# Patient Record
Sex: Male | Born: 2005 | Race: Black or African American | Hispanic: No | Marital: Single | Smoking: Never smoker
Health system: Southern US, Community
[De-identification: ages and names within clinical notes are randomized; demographics above are authoritative.]

## PROBLEM LIST (undated history)

## (undated) DIAGNOSIS — T7840XA Allergy, unspecified, initial encounter: Secondary | ICD-10-CM

## (undated) HISTORY — PX: CRANIECTOMY FOR CRANIOSYNOSTOSIS: SUR323

## (undated) HISTORY — PX: EYE SURGERY: SHX253

---
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
## 2024-03-07 ENCOUNTER — Encounter (HOSPITAL_BASED_OUTPATIENT_CLINIC_OR_DEPARTMENT_OTHER): Payer: Self-pay | Admitting: *Deleted

## 2024-03-14 ENCOUNTER — Ambulatory Visit (HOSPITAL_BASED_OUTPATIENT_CLINIC_OR_DEPARTMENT_OTHER): Payer: Medicaid Other | Admitting: Family Medicine

## 2024-04-24 ENCOUNTER — Telehealth (HOSPITAL_BASED_OUTPATIENT_CLINIC_OR_DEPARTMENT_OTHER): Payer: Self-pay | Admitting: *Deleted

## 2024-04-24 NOTE — Telephone Encounter (Signed)
 Printed out last wellness examination for pick up

## 2024-04-24 NOTE — Telephone Encounter (Signed)
 Please let patient know this cannot be emailed. She can have the copy in [place] or she can come by and pick up a copy of this physical. If not able to come by, can mail patient a copy

## 2024-04-24 NOTE — Telephone Encounter (Signed)
 Copied from CRM #[id number]. Topic: General - Other >> Apr 20, 2024  3:57 PM Shardie S wrote: Reason for CRM: Patient is requesting a copy of his recent physical. States he would like to have a copy emailed.

## 2024-09-14 ENCOUNTER — Telehealth (HOSPITAL_BASED_OUTPATIENT_CLINIC_OR_DEPARTMENT_OTHER): Payer: Self-pay | Admitting: Family Medicine

## 2024-09-14 NOTE — Telephone Encounter (Signed)
 Copied from CRM #[id number]. Topic: Appointments - Scheduling Inquiry for Clinic >> Sep 14, 2024  8:24 AM Jeoffrey H wrote: Perley (patients mom) called and stated he is going back to school on 09/21/2024 and is needed a physical before then. There are no openings until 12/2024. Please reach out to mom for an earlier appt.    Perley- [phone]   See if we can put on Lauraine Minerva schedule please

## 2024-09-18 ENCOUNTER — Ambulatory Visit (INDEPENDENT_AMBULATORY_CARE_PROVIDER_SITE_OTHER)

## 2024-09-18 ENCOUNTER — Encounter (HOSPITAL_BASED_OUTPATIENT_CLINIC_OR_DEPARTMENT_OTHER): Payer: Self-pay

## 2024-09-18 VITALS — BP 127/81 | HR 88 | Ht 67.0 in | Wt 178.1 lb

## 2024-09-18 DIAGNOSIS — Z0000 Encounter for general adult medical examination without abnormal findings: Secondary | ICD-10-CM

## 2024-09-18 LAB — LIPID PANEL
Chol/HDL Ratio: 3.9 ratio (ref 0.0–5.0)
Cholesterol, Total: 210 mg/dL — ABNORMAL HIGH (ref 100–169)
HDL: 54 mg/dL
LDL Chol Calc (NIH): 146 mg/dL — ABNORMAL HIGH (ref 0–109)
Triglycerides: 57 mg/dL (ref 0–89)
VLDL Cholesterol Cal: 10 mg/dL (ref 5–40)

## 2024-09-18 LAB — CBC WITH DIFFERENTIAL/PLATELET
Basophils Absolute: 0 x10E3/uL (ref 0.0–0.2)
Basos: 1 %
EOS (ABSOLUTE): 0.1 x10E3/uL (ref 0.0–0.4)
Eos: 2 %
Hematocrit: 45 % (ref 37.5–51.0)
Hemoglobin: 14.8 g/dL (ref 13.0–17.7)
Immature Grans (Abs): 0 x10E3/uL (ref 0.0–0.1)
Immature Granulocytes: 0 %
Lymphocytes Absolute: 2.1 x10E3/uL (ref 0.7–3.1)
Lymphs: 47 %
MCH: 30.6 pg (ref 26.6–33.0)
MCHC: 32.9 g/dL (ref 31.5–35.7)
MCV: 93 fL (ref 79–97)
Monocytes Absolute: 0.4 x10E3/uL (ref 0.1–0.9)
Monocytes: 8 %
Neutrophils Absolute: 1.8 x10E3/uL (ref 1.4–7.0)
Neutrophils: 42 %
Platelets: 316 x10E3/uL (ref 150–450)
RBC: 4.83 x10E6/uL (ref 4.14–5.80)
RDW: 12.5 % (ref 11.6–15.4)
WBC: 4.4 x10E3/uL (ref 3.4–10.8)

## 2024-09-18 LAB — COMPREHENSIVE METABOLIC PANEL WITH GFR
ALT: 9 IU/L (ref 0–44)
AST: 7 IU/L (ref 0–40)
Albumin: 5.1 g/dL (ref 4.3–5.2)
Alkaline Phosphatase: 67 IU/L (ref 51–125)
BUN/Creatinine Ratio: 13 (ref 9–20)
BUN: 16 mg/dL (ref 6–20)
Bilirubin Total: 0.4 mg/dL (ref 0.0–1.2)
CO2: 23 mmol/L (ref 20–29)
Calcium: 9.9 mg/dL (ref 8.7–10.2)
Chloride: 106 mmol/L (ref 96–106)
Creatinine, Ser: 1.2 mg/dL (ref 0.76–1.27)
Globulin, Total: 2.3 g/dL (ref 1.5–4.5)
Glucose: 107 mg/dL — ABNORMAL HIGH (ref 70–99)
Potassium: 4.3 mmol/L (ref 3.5–5.2)
Sodium: 146 mmol/L — ABNORMAL HIGH (ref 134–144)
Total Protein: 7.4 g/dL (ref 6.0–8.5)
eGFR: 89 mL/min/1.73

## 2024-09-18 LAB — HEMOGLOBIN A1C
Est. average glucose Bld gHb Est-mCnc: 114 mg/dL
Hgb A1c MFr Bld: 5.6 % (ref 4.8–5.6)

## 2024-09-18 LAB — TSH: TSH: 1.93 u[IU]/mL (ref 0.450–4.500)

## 2024-09-18 NOTE — Progress Notes (Signed)
 "  Subjective:   Jason Hoffman 08/06/2006 09/18/2024  CC: Chief Complaint  Patient presents with   Annual Exam    Pt is here for a physical for school. Denies any concerns for today's visit.     HPI: Jason Hoffman is a 19 y.o. male who presents for a routine health maintenance exam.  Labs collected at time of visit.   HEALTH SCREENINGS: - Vision Screening: up to date - Dental Visits: up to date - Testicular Exam: Not applicable - STD Screening: Not applicable - PSA (50+): Not applicable  No results found for: PSA1, PSA   - Colonoscopy (45+): Not applicable  Discussed with patient purpose of the colonoscopy is to detect colon cancer at curable precancerous or early stages  - AAA Screening: Not applicable  Men age 74-75 who have ever smoked - Lung Cancer screening with low-dose CT: Not applicable-  Adults age 19-80 who are current cigarette smokers or quit within the last 15 years. Must have 20 pack year history.   Depression and Anxiety Screen done today and results listed below:     09/18/2024   10:55 AM 09/15/2023    9:00 AM 08/04/2023    9:31 AM  Depression screen PHQ 2/9  Decreased Interest 0 0 0  Down, Depressed, Hopeless 0 0 0  PHQ - 2 Score 0 0 0  Altered sleeping  0 0  Tired, decreased energy  0 0  Change in appetite  0 0  Feeling bad or failure about yourself   0 0  Trouble concentrating  0 0  Moving slowly or fidgety/restless  0 0  Suicidal thoughts  0 0  PHQ-9 Score  0  0   Difficult doing work/chores  Not difficult at all Not difficult at all     Data saved with a previous flowsheet row definition      09/15/2023    9:00 AM 08/04/2023    9:31 AM  GAD 7 : Generalized Anxiety Score  Nervous, Anxious, on Edge 0 0  Control/stop worrying 0 0  Worry too much - different things 0 0  Trouble relaxing 0 0  Restless 0 0  Easily annoyed or irritable 0 0  Afraid - awful might happen 0 0  Total GAD 7 Score 0 0  Anxiety Difficulty Not difficult at all Not  difficult at all    IMMUNIZATIONS:  - Tdap: Tetanus vaccination status reviewed: last tetanus booster within 10 years. - Influenza: Refused - Pneumovax: Not applicable - Prevnar: Not applicable - Shingrix vaccine (50+): Not applicable   Past medical history, surgical history, medications, allergies, family history and social history reviewed with patient today and changes made to appropriate areas of the chart.   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   albuterol  (VENTOLIN  HFA) 108 (90 Base) MCG/ACT inhaler Inhale 2 puffs into the  lungs every 6 (six) hours as needed for wheezing or shortness of breath.   budesonide -formoterol  (SYMBICORT ) 80-4.5 MCG/ACT inhaler Inhale 2 puffs into the lungs 2 (two) times daily.   cetirizine (ZYRTEC) 10 MG chewable tablet Chew 10 mg by mouth daily.   No current facility-administered medications on file prior to visit.    Allergies[1]   Social History   Socioeconomic History   Marital status: Single    Spouse name: Not on file   Number of children: Not on file   Years of education: Not on file   Highest education level: Not on file  Occupational History   Not on file  Tobacco Use   Smoking status: Never    Passive exposure: Never   Smokeless tobacco: Never  Vaping Use   Vaping status: Never Used  Substance and Sexual Activity   Alcohol use: No   Drug use: No   Sexual activity: Not on file  Other Topics  Concern   Not on file  Social History Narrative   Not on file   Social Drivers of Health   Tobacco Use: Low Risk (09/18/2024)   Patient History    Smoking Tobacco Use: Never    Smokeless Tobacco Use: Never    Passive Exposure: Never  Financial Resource Strain: Not on file  Food Insecurity: No Food Insecurity (09/18/2024)   Epic    Worried About Programme Researcher, Broadcasting/film/video in the Last Year: Never true    Ran Out of Food in the Last Year: Never true  Transportation Needs: No Transportation Needs (09/18/2024)   Epic    Lack of Transportation (Medical): No    Lack of Transportation (Non-Medical): No  Physical Activity: Sufficiently Active (09/18/2024)   Exercise Vital Sign    Days of Exercise per Week: 5 days    Minutes of Exercise per Session: 120 min  Stress: Not on file  Social Connections: Unknown (09/18/2024)   Social Connection and Isolation Panel    Frequency of Communication with Friends and Family: Three times a week    Frequency of Social Gatherings with Friends and Family: More than three times a week    Attends Religious Services: Never    Database Administrator or Organizations: No    Attends Banker Meetings: Never    Marital Status: Not on file  Intimate Partner Violence: Not At Risk (09/18/2024)   Epic    Fear of Current or Ex-Partner: No    Emotionally Abused: No    Physically Abused: No    Sexually Abused: No  Depression (PHQ2-9): Low Risk (09/18/2024)   Depression (PHQ2-9)    PHQ-2 Score: 0  Alcohol Screen: Low Risk (09/18/2024)   Alcohol Screen    Last Alcohol Screening Score (AUDIT): 0  Housing: Low Risk (09/18/2024)   Epic    Unable to Pay for Housing in the Last Year: No    Number of Times Moved in the Last Year: 0    Homeless in the Last Year: No  Utilities: Not At Risk (09/18/2024)   Epic    Threatened with loss of utilities: No  Health Literacy: Not on file   Tobacco Use History[2] Social History   Substance and Sexual Activity  Alcohol Use  No     Family History  Problem Relation Age of Onset   Asthma Mother      ROS: Denies fever, fatigue, unexplained weight loss/gain, CP, SHOB, and palpatitations. Denies neurological deficits, gastrointestinal and/or genitourinary complaints, and skin  changes.   Objective:   Today's Vitals   09/18/24 1040  BP: 127/81  Pulse: 88  SpO2: 99%  Weight: 178 lb 1.6 oz (80.8 kg)  Height: 5' 7 (1.702 m)    GENERAL APPEARANCE: Well-appearing, in NAD. Well nourished.  SKIN: Pink, warm and dry. Turgor normal. No rash, lesion, ulceration, or ecchymoses. Hair evenly distributed.  HEENT: HEAD: Normocephalic.  EYES: PERRLA. EOMI. Lids intact w/o defect. Sclera white, Conjunctiva pink w/o exudate.  EARS: External ear w/o redness, swelling, masses or lesions. EAC clear. TM's intact, translucent w/o bulging, appropriate landmarks visualized. Appropriate acuity to conversational tones.  NOSE: Septum midline w/o deformity. Nares patent, mucosa pink and non-inflamed w/o drainage. No sinus tenderness.  THROAT: Uvula midline. Oropharynx clear. Tonsils non-inflamed w/o exudate. Oral mucosa pink and moist.  NECK: Supple, Trachea midline. Full ROM w/o pain or tenderness. No lymphadenopathy. Thyroid  non-tender w/o enlargement or palpable masses.  RESPIRATORY: Chest wall symmetrical w/o masses. Respirations even and non-labored. Breath sounds clear to auscultation bilaterally. No wheezes, rales, rhonchi, or crackles. CARDIAC: S1, S2 present, regular rate and rhythm. No gallops, murmurs, rubs, or clicks. PMI w/o lifts, heaves, or thrills. No carotid bruits. Capillary refill <2 seconds. Peripheral pulses 2+ bilaterally. GI: Abdomen soft w/o distention. Normoactive bowel sounds. No palpable masses or tenderness. No guarding or rebound tenderness. Liver and spleen w/o tenderness or enlargement. No CVA tenderness.  MSK: Muscle tone and strength appropriate for age, w/o atrophy or abnormal movement. EXTREMITIES:  Active ROM intact, w/o tenderness, crepitus, or contracture. No obvious joint deformities or effusions. No clubbing, edema, or cyanosis.  NEUROLOGIC: CN's II-XII intact. Motor strength symmetrical with no obvious weakness. No sensory deficits. DTR 2+ symmetric bilaterally. Steady, even gait.  PSYCH/MENTAL STATUS: Alert, oriented x 3. Cooperative, appropriate mood and affect.   Assessment & Plan:  1. Annual physical exam (Primary) Discussed preventative screenings, vaccines, lab work, and healthy lifestyle with patient. Discussed following up with Dr. Penne in approximately 4 months for further discussion regarding various vaccines.  - Comprehensive metabolic panel with GFR - CBC with Differential/Platelet - Hemoglobin A1c - TSH - Lipid panel   PATIENT COUNSELING: - Encouraged to adjust caloric intake to maintain or achieve ideal body weight, to reduce intake of dietary saturated fat and total fat, to limit sodium intake by avoiding high sodium foods and not adding table salt, and to maintain adequate dietary potassium and calcium preferably from fresh fruits, vegetables, and low-fat dairy products.   - Advised to avoid cigarette smoking. - Discussed with the patient that most people either abstain from alcohol or drink within safe limits (<=14/week and <=4 drinks/occasion for males, <=7/weeks and <= 3 drinks/occasion for females) and that the risk for alcohol disorders and other health effects rises proportionally with the number of drinks per week and how often a drinker exceeds daily limits. - Discussed cessation/primary prevention of drug use and availability of treatment for abuse.  - Stressed the importance of regular exercise - Injury prevention: Discussed safety belts, safety helmets, smoke detector, smoking near bedding or upholstery.  - Dental health: Discussed importance of regular tooth brushing, flossing, and dental visits.  - Sexuality: Discussed sexually transmitted diseases,  partner selection, use of condoms, avoidance of unintended pregnancy  and contraceptive alternatives.   NEXT PREVENTATIVE PHYSICAL DUE IN 1 YEAR.  Return in about 4 months (around 01/05/2025) for follow up with [facility].  Patient to reach out to office if new, worrisome, or unresolved symptoms arise or if no improvement  in patient's condition. Patient verbalized understanding and is agreeable to treatment plan. All questions answered to patient's satisfaction.    Lauraine Almarie Angus DNP, FNP-C    [1] No Known Allergies [2]  Social History Tobacco Use  Smoking Status Never   Passive exposure: Never  Smokeless Tobacco Never   "

## 2024-09-19 ENCOUNTER — Ambulatory Visit (HOSPITAL_BASED_OUTPATIENT_CLINIC_OR_DEPARTMENT_OTHER): Payer: Self-pay

## 2024-09-19 NOTE — Progress Notes (Signed)
 Hi Delyle, In looking over your lab work everything looks great. The only concern is your lipid levels (cholesterol) are elevated compared to a year ago. Let's make sure to prioritize healthy fats and decrease the amount of fried/processed foods in your diet. We will otherwise keep an eye on it!  Lauraine Almarie Angus DNP, FNP-C

## 2024-10-10 ENCOUNTER — Other Ambulatory Visit (HOSPITAL_BASED_OUTPATIENT_CLINIC_OR_DEPARTMENT_OTHER): Payer: Self-pay | Admitting: Family Medicine

## 2025-01-22 ENCOUNTER — Ambulatory Visit (HOSPITAL_BASED_OUTPATIENT_CLINIC_OR_DEPARTMENT_OTHER): Admitting: Family Medicine
